# Patient Record
Sex: Female | Born: 1985 | Race: White | Hispanic: No | Marital: Single | Smoking: Current every day smoker
Health system: Southern US, Community
[De-identification: ages and names within clinical notes are randomized; demographics above are authoritative.]

---
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
## 2006-01-27 IMAGING — CT CT CERVICAL SPINE W/O CM
2 of 8 series · 11 of 30 positions shown, 13 images · non-contrast
Comparison: none

CLINICAL DATA: 18 year old kicked in the head by a horse.
 NONCONTRAST CRANIAL CT ? 09/21/04: 
 Standard head CT was performed without contrast.  No prior studies are available for comparison.
 There is prominent adenoidal tissue noted incidentally.  The ventricles are in the midline without mass effect or shift.  They are normal in size and configuration.  No extra-axial fluid collections are seen.  There is no CT evidence for acute intracranial abnormality and no intracranial mass lesions.  The brainstem and cerebellum appear normal.  The gray-white differentiation is maintained.  The bony calvarium is intact.  No skull fractures are seen.  The visualized paranasal sinuses and mastoid air cells are clear.  There is calcification involving the vertebral arteries and also the cavernous portion of the internal carotid arteries.  This could be secondary to long-standing diabetes or hyperparathyroidism.  Given her young age, I would recommend further evaluation of this process.

[Series 104: cervical spine · axial · 0.23mm/px · z∈[-286,-179]mm · 5 of 257 slices shown, 7 images]
[im 43/257  soft-tissue]
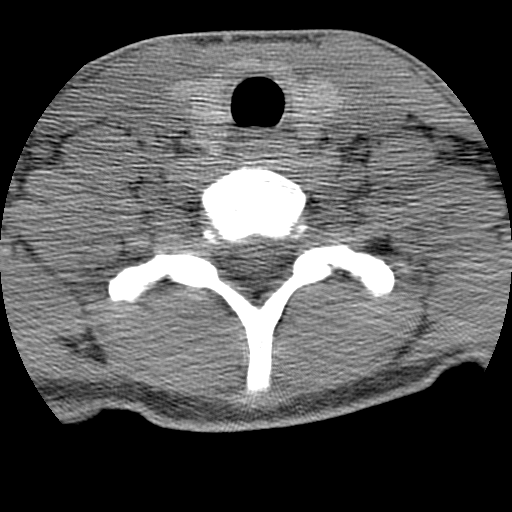
[im 43/257  bone]
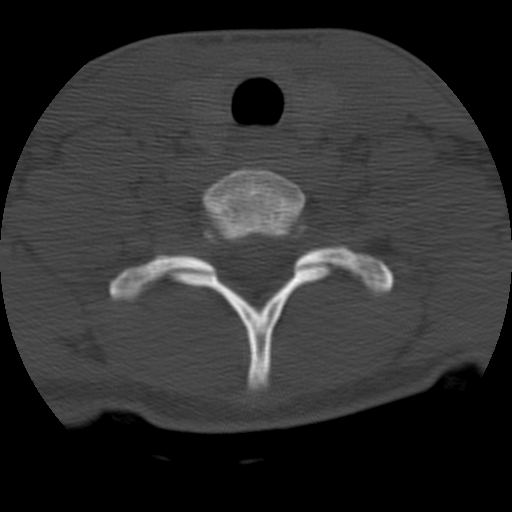
[im 86/257  bone]
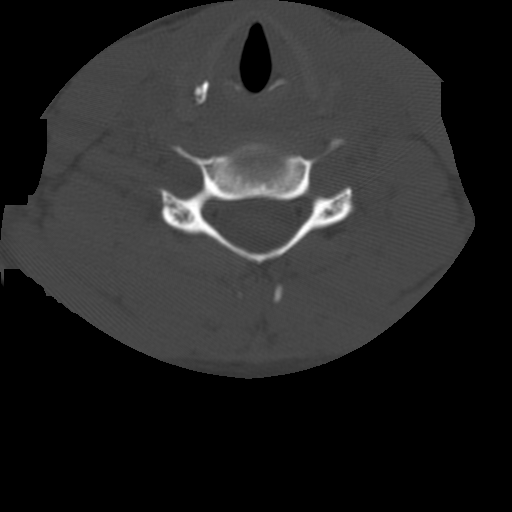
[im 129/257  bone]
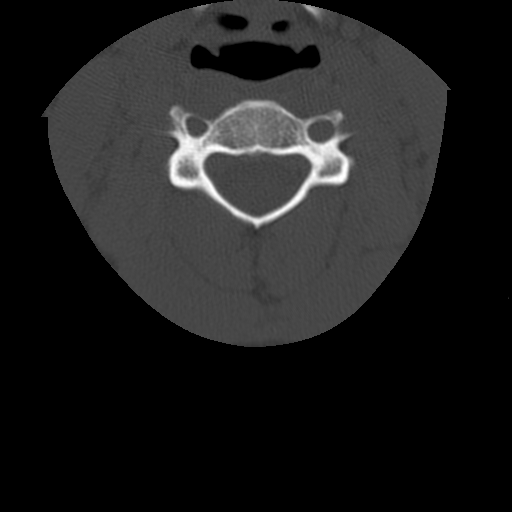
[im 171/257  bone]
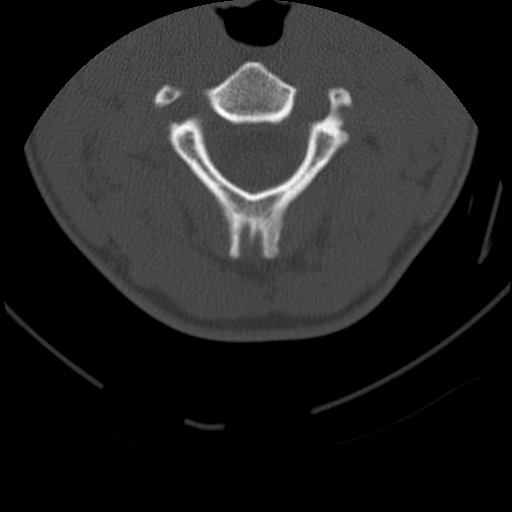
[im 214/257  soft-tissue]
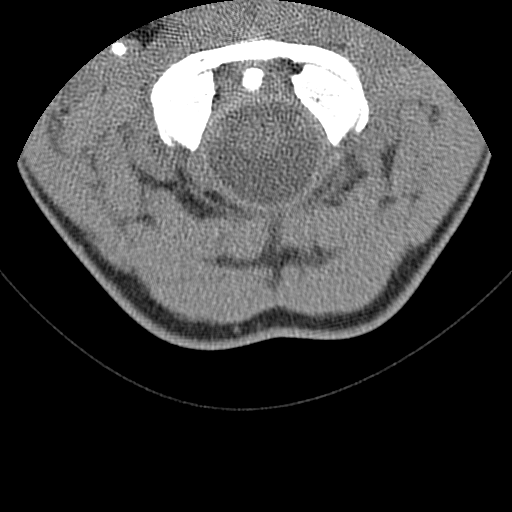
[im 214/257  bone]
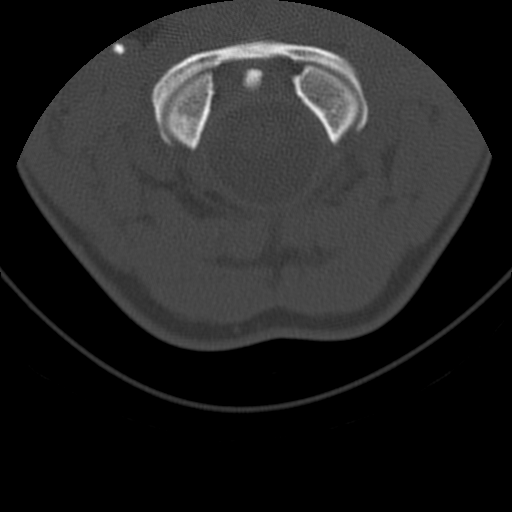

[Series 106: reformatted · coronal · 0.32mm/px · 6 of 45 slices shown]
[im 14/45  soft-tissue]
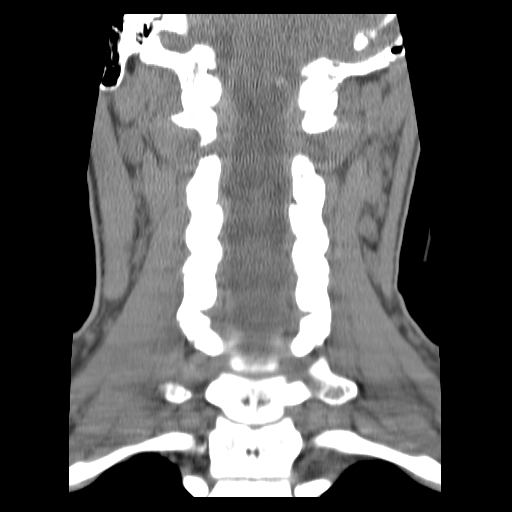
[im 15/45  bone]
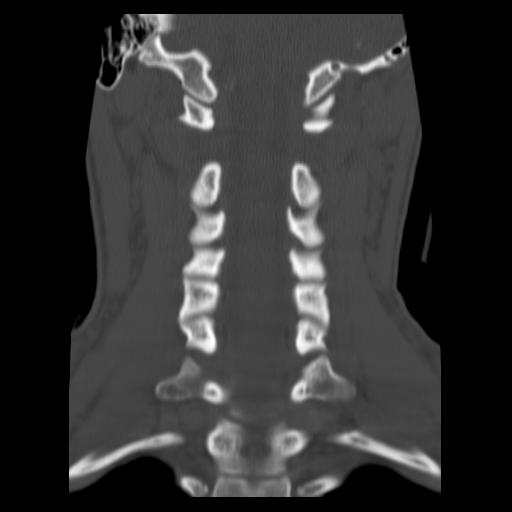
[im 19/45  bone]
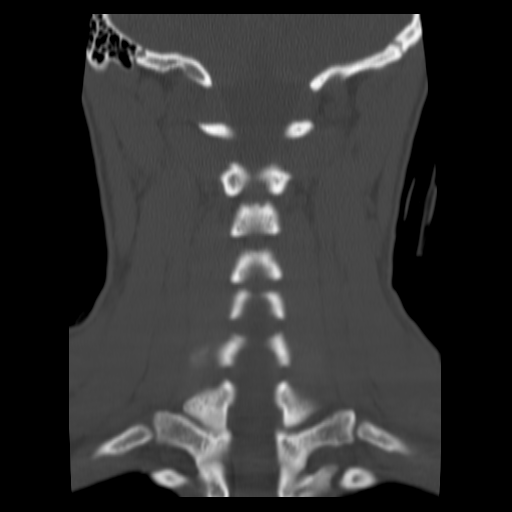
[im 23/45  bone]
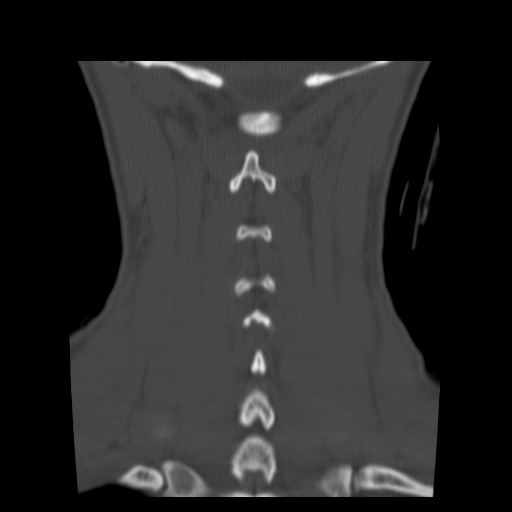
[im 26/45  bone]
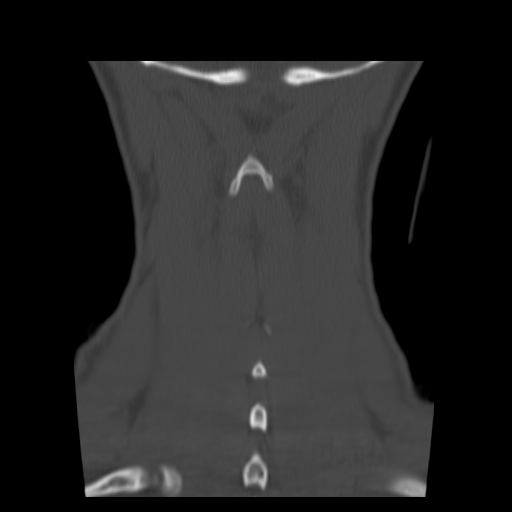
[im 30/45  bone]
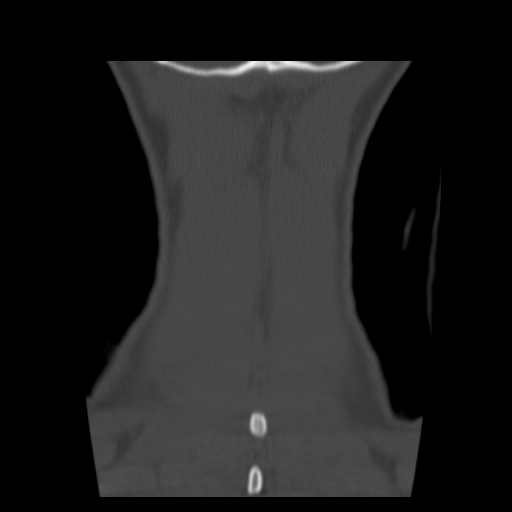

[11 of 30 positions shown; findings below may reference images not displayed]

IMPRESSION: 1.    No acute intracranial findings and no skull fracture.
 2.  Early calcification involving the vertebral and cavernous portions of the internal carotid arteries as discussed above.  This needs follow-up given this patient?s young age.
 CT CERVICAL SPINE WITHOUT CONTRAST:
 Axial CT examination of the cervical spine was performed and coronal and sagittal reformatted images were obtained.  There is no evidence for cervical spine fracture.  The facets are aligned and the neural foramina are patent.  There is a small lesion in the right thyroid gland along with a few tiny lesions in the left thyroid.  Sonographic follow-up is suggested.  
 I don?t see any calcification involving the carotid arteries.  
 Sagittal and coronal reformatted images demonstrate normal alignment.  The skull base-EX and C1-C2 articulations are normal.  The dens is normal.  The facets are aligned.  No abnormal prevertebral soft tissue swelling.  The spinous processes are intact.
IMPRESSION: Normal alignment and no acute bony findings in the cervical spine.

## 2016-07-28 ENCOUNTER — Encounter (HOSPITAL_COMMUNITY): Payer: Self-pay

## 2016-07-28 ENCOUNTER — Emergency Department (HOSPITAL_COMMUNITY)
Admission: EM | Admit: 2016-07-28 | Discharge: 2016-07-28 | Disposition: A | Discharge status: Home / Self Care | Payer: 59 | Attending: Emergency Medicine | Admitting: Emergency Medicine

## 2016-07-28 DIAGNOSIS — F17200 Nicotine dependence, unspecified, uncomplicated: Secondary | ICD-10-CM | POA: Diagnosis not present

## 2016-07-28 DIAGNOSIS — Y929 Unspecified place or not applicable: Secondary | ICD-10-CM | POA: Diagnosis not present

## 2016-07-28 DIAGNOSIS — S39012A Strain of muscle, fascia and tendon of lower back, initial encounter: Secondary | ICD-10-CM | POA: Diagnosis not present

## 2016-07-28 DIAGNOSIS — X500XXA Overexertion from strenuous movement or load, initial encounter: Secondary | ICD-10-CM | POA: Diagnosis not present

## 2016-07-28 DIAGNOSIS — Y990 Civilian activity done for income or pay: Secondary | ICD-10-CM | POA: Diagnosis not present

## 2016-07-28 DIAGNOSIS — S3992XA Unspecified injury of lower back, initial encounter: Secondary | ICD-10-CM | POA: Diagnosis present

## 2016-07-28 DIAGNOSIS — Y939 Activity, unspecified: Secondary | ICD-10-CM | POA: Insufficient documentation

## 2016-07-28 MED ORDER — HYDROCODONE-ACETAMINOPHEN 5-325 MG PO TABS
1.0000 | ORAL_TABLET | Freq: Once | ORAL | Status: AC
  Administered 2016-07-28: 1 via ORAL
  Filled 2016-07-28: qty 1

## 2016-07-28 MED ORDER — DICLOFENAC SODIUM 1 % TD GEL: 2.0000 g | Freq: Four times a day (QID) | TRANSDERMAL | 0 refills | Status: AC

## 2016-07-28 NOTE — ED Provider Notes (Signed)
[facility]-EMERGENCY DEPT Provider Note   CSN: [id number] Arrival date & time: 07/28/16  2100  By signing my name below, I, Hailey Salinas, attest that this documentation has been prepared under the direction and in the presence of [facility] LLCJaime Armetta Henri, PA-C. Electronically Signed: Freida Busmaniana Salinas, Scribe. 07/28/2016. 10:23 PM.   History   Chief Complaint Chief Complaint  Patient presents with  . Back Pain     The history is provided by the patient. No language interpreter was used.    HPI Comments:  Hailey Salinas is a 31 y.o. female who presents to the Emergency Department complaining of gradual onset, mid to lower back pain x a few days, worse today. She notes caring for her mother who had surgery~ 2 weeks ago; states she was doing a lot of lifting. One day had to help mother out of a low chair and her mother lost her balance and the pt had to catch. 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  No other known injuries. ~1 week ago went to Urgent Care given tramadol and methocarbamol which provided no relief. She went to Urgent care again today and had a negative x-ray of her back. She was discharged with prednisone, which she has had one dose of, and methocarbamol. No fever chills, numbness/weakness/tingling, or bowel/bladdder incontinence.   History reviewed. No pertinent past medical history.  There are no active problems to display for this patient.   History reviewed. No pertinent surgical history.  OB History    No data available       Home Medications    Prior to Admission medications   Not on File    Family History No family history on file.  Social History Social History  Substance Use Topics  . Smoking status: Current Every Day Smoker  . Smokeless tobacco: Never Used  . Alcohol use Yes     Allergies   Ibuprofen and Pineapple   Review of Systems Review of Systems  Musculoskeletal: Positive for back pain.     Physical Exam Updated Vital Signs BP  115/81   Pulse 89   Temp 97.7 F (36.5 C) (Oral)   Resp 18   Ht 5\' 3"  (1.6 m)   Wt 133 lb (60.3 kg)   LMP 07/26/2016   SpO2 100%   BMI 23.56 kg/m   Physical Exam  Constitutional: She is oriented to person, place, and time. She appears well-developed and well-nourished. No distress.  HENT:  Head: Normocephalic and atraumatic.  Cardiovascular: Normal rate, regular rhythm and normal heart sounds.   No murmur heard. Pulmonary/Chest: Effort normal and breath sounds normal. No respiratory distress.  Abdominal: Soft. She exhibits no distension. There is no tenderness.  Musculoskeletal: Normal range of motion. She exhibits tenderness. She exhibits no edema.  No C/T/L spine tenderness Tenderness to thoracic and lumbar paraspinal muscles SLR is negative bilaterally  5/5 muscle strength of lower extremities   Neurological: She is alert and oriented to person, place, and time.  BLE NVI  Skin: Skin is warm and dry.  Nursing note and vitals reviewed.    ED Treatments / Results  DIAGNOSTIC STUDIES:  Oxygen Saturation is 100% on RA, normal by my interpretation.    COORDINATION OF CARE:  10:36 PM Discussed treatment plan with pt at bedside and pt agreed to plan.  Labs (all labs ordered are listed, but only abnormal results are displayed) Labs Reviewed - No data to display  EKG  EKG Interpretation None  Radiology No results found.  Procedures Procedures (including critical care time)  Medications Ordered in ED Medications - No data to display   Initial Impression / Assessment and Plan / ED Course  I have reviewed the triage vital signs and the nursing notes.  Pertinent labs & imaging results that were available during my care of the patient were reviewed by me and considered in my medical decision making (see chart for details).     Hailey Salinas presents with back pain. Patient demonstrates no lower extremity weakness, saddle anesthesia, bowel or bladder  incontinence, or neuro deficits. No concern for cauda equina. No fevers or other infectious symptoms to suggest that the patient's back pain is due to an infection. Lower extremities are neurovascularly intact and patient is ambulating without difficulty. I have reviewed return precautions, including the development of any of these signs or symptoms, and the patient has voiced understanding. I reviewed supportive care instructions, including continuing prednisone of robaxin. Will give rx for voltaren gel as well. PCP follow-up if symptoms persist. Patient voiced understanding and agreement with plan.   Final Clinical Impressions(s) / ED Diagnoses   Final diagnoses:  None    New Prescriptions New Prescriptions   No medications on file   I personally performed the services described in this documentation, which was scribed in my presence. The recorded information has been reviewed and is accurate.    [facility] Brizza Nathanson, PA-C 07/29/16 0001    Hailey Monday, MD 07/29/16 1349

## 2016-07-28 NOTE — ED Notes (Signed)
Pt verbalized understanding of discharge instructions and had no further questions. NAD VSS A/O ambulatory to the lobby.

## 2016-07-28 NOTE — Discharge Instructions (Signed)
Continue your muscle relaxer and prednisone. Apply gel up to four times daily as needed for pain. Ice or heat for additional pain relief. Follow up with your primary care provider if symptoms persist longer than one week.   Be aware that if you develop new symptoms such as a fever, leg weakness, difficulty with or loss of control of your urine or bowels, abdominal pain, or more severe pain, you will need to seek medical attention and  / or return to the Emergency department.  COLD THERAPY DIRECTIONS:  Ice or gel packs can be used to reduce both pain and swelling. Ice is the most helpful within the first 24 to 48 hours after an injury or flareup from overusing a muscle or joint.  Ice is effective, has very few side effects, and is safe for most people to use.   If you expose your skin to cold temperatures for too long or without the proper protection, you can damage your skin or nerves. Watch for signs of skin damage due to cold.   HOME CARE INSTRUCTIONS  Follow these tips to use ice and cold packs safely.  Place a dry or damp towel between the ice and skin. A damp towel will cool the skin more quickly, so you may need to shorten the time that the ice is used.  For a more rapid response, add gentle compression to the ice.  Ice for no more than 10 to 20 minutes at a time. The bonier the area you are icing, the less time it will take to get the benefits of ice.  Check your skin after 5 minutes to make sure there are no signs of a poor response to cold or skin damage.  Rest 20 minutes or more in between uses.  Once your skin is numb, you can end your treatment. You can test numbness by very lightly touching your skin. The touch should be so light that you do not see the skin dimple from the pressure of your fingertip. When using ice, most people will feel these normal sensations in this order: cold, burning, aching, and numbness.

## 2016-07-28 NOTE — ED Triage Notes (Signed)
Pt states that since Jan she has had back pain trying to catch someone from falling, [facility], unrelieved by medications from [facility].

## 2016-07-28 NOTE — ED Notes (Signed)
[facility] her prednisone and robaxin and did an xray, no relief today.
